# Patient Record
Sex: Female | Born: 1975 | Race: White | Hispanic: No | Smoking: Current every day smoker
Health system: Southern US, Community
[De-identification: ages and names within clinical notes are randomized; demographics above are authoritative.]

## PROBLEM LIST (undated history)

## (undated) DIAGNOSIS — F329 Major depressive disorder, single episode, unspecified: Secondary | ICD-10-CM

## (undated) DIAGNOSIS — F32A Depression, unspecified: Secondary | ICD-10-CM

## (undated) DIAGNOSIS — T7840XA Allergy, unspecified, initial encounter: Secondary | ICD-10-CM

## (undated) DIAGNOSIS — F419 Anxiety disorder, unspecified: Secondary | ICD-10-CM

## (undated) HISTORY — DX: Allergy, unspecified, initial encounter: T78.40XA

## (undated) HISTORY — DX: Anxiety disorder, unspecified: F41.9

## (undated) HISTORY — DX: Major depressive disorder, single episode, unspecified: F32.9

## (undated) HISTORY — DX: Depression, unspecified: F32.A

---

## 2013-02-28 ENCOUNTER — Ambulatory Visit (INDEPENDENT_AMBULATORY_CARE_PROVIDER_SITE_OTHER): Payer: 59 | Admitting: Emergency Medicine

## 2013-02-28 VITALS — BP 124/70 | HR 83 | Temp 97.8°F | Resp 18 | Ht 67.0 in | Wt 223.0 lb

## 2013-02-28 DIAGNOSIS — F329 Major depressive disorder, single episode, unspecified: Secondary | ICD-10-CM

## 2013-02-28 DIAGNOSIS — F3289 Other specified depressive episodes: Secondary | ICD-10-CM

## 2013-02-28 DIAGNOSIS — F32A Depression, unspecified: Secondary | ICD-10-CM

## 2013-02-28 MED ORDER — DESVENLAFAXINE SUCCINATE ER 50 MG PO TB24: 50.0000 mg | ORAL_TABLET | Freq: Every day | ORAL | Status: AC

## 2013-02-28 NOTE — Patient Instructions (Signed)

## 2013-02-28 NOTE — Progress Notes (Signed)
[facility] [address], [place] [address] [phone] [phone]  Date:  02/28/2013   Name:  Jullie Arps   DOB:  Sep 05, 1975   MRN:  [id number]  PCP:  No primary provider on file.    Chief Complaint: rx refills   History of Present Illness:  Stpehanie Montroy is a 37 y.o. very pleasant female patient who presents with the following:  [place] and wants to establish care needs a refill on her antidepressant which she has taken for years.  Says it has controlled her symptoms well.  No adverse effects.  No improvement with over the counter medications or other home remedies. Denies other complaint or health concern today.   There are no active problems to display for this patient.   Past Medical History  Diagnosis Date  . Allergy   . Anxiety   . Depression     History reviewed. No pertinent past surgical history.  History  Substance Use Topics  . Smoking status: Current Every Day Smoker  . Smokeless tobacco: Not on file  . Alcohol Use: Yes    Family History  Problem Relation Age of Onset  . Hyperlipidemia Father   . Cancer Maternal Grandmother   . Mental illness Maternal Grandfather   . Heart disease Paternal Grandfather   . Hyperlipidemia Paternal Grandfather     No Known Allergies  Medication list has been reviewed and updated.  No current outpatient prescriptions on file prior to visit.   No current facility-administered medications on file prior to visit.    Review of Systems:  As per HPI, otherwise negative.    Physical Examination: Filed Vitals:   02/28/13 1505  BP: 124/70  Pulse: 83  Temp: 97.8 F (36.6 C)  Resp: 18   Filed Vitals:   02/28/13 1505  Height: 5\' 7"  (1.702 m)  Weight: 223 lb (101.152 kg)   Body mass index is 34.92 kg/(m^2). Ideal Body Weight: Weight in (lb) to have BMI = 25: 159.3  GEN: WDWN, NAD, Non-toxic, A & O x 3 HEENT: Atraumatic, Normocephalic. Neck supple. No masses, No LAD. Ears  and Nose: No external deformity. CV: RRR, No M/G/R. No JVD. No thrill. No extra heart sounds. PULM: CTA B, no wheezes, crackles, rhonchi. No retractions. No resp. distress. No accessory muscle use. ABD: S, NT, ND, +BS. No rebound. No HSM. EXTR: No c/c/e NEURO Normal gait.  PSYCH: Normally interactive. Conversant. Not depressed or anxious appearing.  Calm demeanor.    Assessment and Plan: Depression Continue medication  Follow up in 104.   Signed,  Phillips Odor, MD

## 2015-03-18 ENCOUNTER — Emergency Department (HOSPITAL_COMMUNITY)
Admission: EM | Admit: 2015-03-18 | Discharge: 2015-03-18 | Disposition: A | Discharge status: Home / Self Care | Payer: BLUE CROSS/BLUE SHIELD | Attending: Emergency Medicine | Admitting: Emergency Medicine

## 2015-03-18 ENCOUNTER — Encounter (HOSPITAL_COMMUNITY): Payer: Self-pay

## 2015-03-18 ENCOUNTER — Emergency Department (HOSPITAL_COMMUNITY): Payer: BLUE CROSS/BLUE SHIELD

## 2015-03-18 DIAGNOSIS — Y9389 Activity, other specified: Secondary | ICD-10-CM | POA: Insufficient documentation

## 2015-03-18 DIAGNOSIS — F329 Major depressive disorder, single episode, unspecified: Secondary | ICD-10-CM | POA: Insufficient documentation

## 2015-03-18 DIAGNOSIS — W540XXA Bitten by dog, initial encounter: Secondary | ICD-10-CM | POA: Insufficient documentation

## 2015-03-18 DIAGNOSIS — F17200 Nicotine dependence, unspecified, uncomplicated: Secondary | ICD-10-CM | POA: Diagnosis not present

## 2015-03-18 DIAGNOSIS — F419 Anxiety disorder, unspecified: Secondary | ICD-10-CM | POA: Diagnosis not present

## 2015-03-18 DIAGNOSIS — S61451A Open bite of right hand, initial encounter: Secondary | ICD-10-CM | POA: Diagnosis not present

## 2015-03-18 DIAGNOSIS — Y998 Other external cause status: Secondary | ICD-10-CM | POA: Insufficient documentation

## 2015-03-18 DIAGNOSIS — Z79899 Other long term (current) drug therapy: Secondary | ICD-10-CM | POA: Insufficient documentation

## 2015-03-18 DIAGNOSIS — Y9289 Other specified places as the place of occurrence of the external cause: Secondary | ICD-10-CM | POA: Diagnosis not present

## 2015-03-18 MED ORDER — LIDOCAINE-EPINEPHRINE 2 %-1:100000 IJ SOLN
INTRAMUSCULAR | Status: AC
  Filled 2015-03-18: qty 1

## 2015-03-18 MED ORDER — LIDOCAINE-EPINEPHRINE (PF) 2 %-1:200000 IJ SOLN
10.0000 mL | Freq: Once | INTRAMUSCULAR | Status: AC
  Administered 2015-03-18: 10 mL via INTRADERMAL

## 2015-03-18 MED ORDER — AMOXICILLIN-POT CLAVULANATE 875-125 MG PO TABS: 1.0000 | ORAL_TABLET | Freq: Two times a day (BID) | ORAL | Status: AC

## 2015-03-18 MED ORDER — TRAMADOL HCL 50 MG PO TABS: 50.0000 mg | ORAL_TABLET | Freq: Four times a day (QID) | ORAL | Status: AC | PRN

## 2015-03-18 MED ORDER — AMOXICILLIN-POT CLAVULANATE 875-125 MG PO TABS
1.0000 | ORAL_TABLET | Freq: Once | ORAL | Status: AC
  Administered 2015-03-18: 1 via ORAL
  Filled 2015-03-18: qty 1

## 2015-03-18 NOTE — ED Provider Notes (Signed)
CSN: [id number]     Arrival date & time 03/18/15  2215 History  By signing my name below, I, Doreatha Martin, attest that this documentation has been prepared under the direction and in the presence of Thi Sisemore, PA-C.  Electronically Signed: Doreatha Martin, ED Scribe. 03/18/2015. 11:08 PM.    Chief Complaint  Patient presents with  . Animal Bite   The history is provided by the patient. No language interpreter was used.    HPI Comments: Anna Hinton is a 39 y.o. female who presents to the Emergency Department complaining of multiple puncture wounds with controlled bleeding to the dorsum of the right hand after a dog bite that occurred at 4 hours ago at 1900. The dogs shots are UTD as of today. She notes that she is fostering the rescue dog and just picked him up today. She reports that the bite was not malicious. Pt states she washed the wounds, applied hydrogen peroxide and antibiotic ointment PTA. She is complaining of moderate pain and swelling around the wounds. Tdap less than 6 years ago. No other injuries. She denies fever or numbness.   Past Medical History  Diagnosis Date  . Allergy   . Anxiety   . Depression    History reviewed. No pertinent past surgical history. Family History  Problem Relation Age of Onset  . Hyperlipidemia Father   . Cancer Maternal Grandmother   . Mental illness Maternal Grandfather   . Heart disease Paternal Grandfather   . Hyperlipidemia Paternal Grandfather    Social History  Substance Use Topics  . Smoking status: Current Every Day Smoker  . Smokeless tobacco: None  . Alcohol Use: Yes   OB History    No data available     Review of Systems  Constitutional: Negative for fever.  Skin: Positive for wound.  Neurological: Negative for numbness.   Allergies  Review of patient's allergies indicates no known allergies.  Home Medications   Prior to Admission medications   Medication Sig Start Date End Date Taking? Authorizing Provider   ALPRAZolam Prudy Feeler) 0.5 MG tablet Take 0.5 mg by mouth at bedtime as needed for sleep. Just takes 1 tablet to sleep prn    Historical Provider, MD  cetirizine (ZYRTEC) 10 MG tablet Take 10 mg by mouth daily.    Historical Provider, MD  desvenlafaxine (PRISTIQ) 50 MG 24 hr tablet Take 1 tablet (50 mg total) by mouth daily. 02/28/13   Carmelina Dane, MD  Pseudoephedrine HCl (SUDAFED 24 HOUR PO) Take by mouth.    Historical Provider, MD   BP 134/91 mmHg  Pulse 101  Temp(Src) 98.2 F (36.8 C) (Oral)  Resp 20  SpO2 100% Physical Exam  Constitutional: She is oriented to person, place, and time. She appears well-developed and well-nourished.  HENT:  Head: Normocephalic and atraumatic.  Eyes: Conjunctivae and EOM are normal. Pupils are equal, round, and reactive to light.  Neck: Normal range of motion. Neck supple.  Cardiovascular: Normal rate.   Pulmonary/Chest: Effort normal. No respiratory distress.  Abdominal: She exhibits no distension.  Musculoskeletal: Normal range of motion.  Several puncture marks to the right dorsal hand. Mild swelling noted over the second and third metacarpals around the puncture wounds. Small puncture wound to the thumb. Appears superficial. Full range of motion of all fingers. Strength is intact with flexion and extension. No erythema surrounding the puncture marks.  Neurological: She is alert and oriented to person, place, and time.  Skin: Skin is warm and dry.  Psychiatric: She has a normal mood and affect. Her behavior is normal.  Nursing note and vitals reviewed.  ED Course  Procedures (including critical care time) DIAGNOSTIC STUDIES: Oxygen Saturation is 100% on RA, normal by my interpretation.    COORDINATION OF CARE: 11:06 PM Discussed treatment plan with pt at bedside and pt agreed to plan.   Imaging Review No results found. I have personally reviewed and evaluated these images as part of my medical decision-making.  MDM   Final diagnoses:   Dog bite of hand without complication, right, initial encounter   Patient with several puncture wounds to the hand. X-rays negative. Numbed puncture wounds with lidocaine, thoroughly irrigated with normal saline using 20-gauge IV catheter. Scrubbed with iodine surgical scrub. Bacitracin and dressing applied. Home with Augmentin prophylactically. Follow-up with hand or return precautions discussed. Pt's tetanus is up to date. States dogs rabies up to date, will verify tomorrow morning.   Filed Vitals:   03/18/15 2223  BP: 134/91  Pulse: 101  Temp: 98.2 F (36.8 C)  TempSrc: Oral  Resp: 20  SpO2: 100%    I personally performed the services described in this documentation, which was scribed in my presence. The recorded information has been reviewed and is accurate.   Jaynie Crumbleatyana Navil Kole, PA-C 03/19/15 [id number]  Tomasita CrumbleAdeleke Oni, MD 03/19/15 [phone]

## 2015-03-18 NOTE — Discharge Instructions (Signed)
Ibuprofen or tylenol for pain. Tramadol for severe pain only. augmentin for infection until all gone. If worsening return to ED or follow up with Dr. Merlyn LotKuzma.    Animal Bite Animal bites can range from mild to serious. An animal bite can result in a scratch on the skin, a deep open cut, a puncture of the skin, a crush injury, or tearing away of the skin or a body part. A small bite from a house pet will usually not cause serious problems. However, some animal bites can become infected or injure a bone or other tissue.  Bites from certain animals can be more dangerous because of the risk of spreading rabies, which is a serious viral infection. This risk is higher with bites from stray animals or wild animals, such as raccoons, foxes, skunks, and bats. Dogs are responsible for most animal bites. Children are bitten more often than adults. SYMPTOMS  Common symptoms of an animal bite include:   Pain.   Bleeding.   Swelling.   Bruising.  DIAGNOSIS  This condition may be diagnosed based on a physical exam and medical history. Your health care provider will examine the wound and ask for details about the animal and how the bite happened. You may also have tests, such as:   Blood tests to check for infection or to determine if surgery is needed.  X-rays to check for damage to bones or joints.  Culture test. This uses a sample of fluid from the wound to check for infection. TREATMENT  Treatment varies depending on the location and type of animal bite and your medical history. Treatment may include:   Wound care. This often includes cleaning the wound, flushing the wound with saline solution, and applying a bandage (dressing). Sometimes, the wound is left open to heal because of the high risk of infection. However, in some cases, the wound may be closed with stitches (sutures), staples, skin glue, or adhesive strips.   Antibiotic medicine.   Tetanus shot.   Rabies treatment if the animal  could have rabies.  In some cases, bites that have become infected may require IV antibiotics and surgical treatment in the hospital.  HOME CARE INSTRUCTIONS Wound Care  Follow instructions from your health care provider about how to take care of your wound. Make sure you:  Wash your hands with soap and water before you change your dressing. If soap and water are not available, use hand sanitizer.  Change your dressing as told by your health care provider.  Leave sutures, skin glue, or adhesive strips in place. These skin closures may need to be in place for 2 weeks or longer. If adhesive strip edges start to loosen and curl up, you may trim the loose edges. Do not remove adhesive strips completely unless your health care provider tells you to do that.  Check your wound every day for signs of infection. Watch for:   Increasing redness, swelling, or pain.   Fluid, blood, or pus.  General Instructions  Take or apply over-the-counter and prescription medicines only as told by your health care provider.   If you were prescribed an antibiotic, take or apply it as told by your health care provider. Do not stop using the antibiotic even if your condition improves.   Keep the injured area raised (elevated) above the level of your heart while you are sitting or lying down, if this is possible.   If directed, apply ice to the injured area.   Put ice  in a plastic bag.   Place a towel between your skin and the bag.   Leave the ice on for 20 minutes, 2-3 times per day.   Keep all follow-up visits as told by your health care provider. This is important.  SEEK MEDICAL CARE IF:  You have increasing redness, swelling, or pain at the site of your wound.   You have a general feeling of sickness (malaise).   You feel nauseous or you vomit.   You have pain that does not get better.  SEEK IMMEDIATE MEDICAL CARE IF:  You have a red streak extending away from your wound.    You have fluid, blood, or pus coming from your wound.   You have a fever or chills.   You have trouble moving your injured area.   You have numbness or tingling extending beyond the wound.   This information is not intended to replace advice given to you by your health care provider. Make sure you discuss any questions you have with your health care provider.   Document Released: 01/03/2011 Document Revised: 01/06/2015 Document Reviewed: 09/02/2014 Elsevier Interactive Patient Education [employer]

## 2015-03-18 NOTE — ED Notes (Signed)
Pt got a rescue dog today and it bit her on the hand this evening, the dog has had it's rabies vaccine

## 2015-09-16 DIAGNOSIS — M9901 Segmental and somatic dysfunction of cervical region: Secondary | ICD-10-CM | POA: Diagnosis not present

## 2015-09-16 DIAGNOSIS — M9903 Segmental and somatic dysfunction of lumbar region: Secondary | ICD-10-CM | POA: Diagnosis not present

## 2015-09-16 DIAGNOSIS — G44209 Tension-type headache, unspecified, not intractable: Secondary | ICD-10-CM | POA: Diagnosis not present

## 2015-09-16 DIAGNOSIS — R201 Hypoesthesia of skin: Secondary | ICD-10-CM | POA: Diagnosis not present

## 2015-09-20 DIAGNOSIS — R201 Hypoesthesia of skin: Secondary | ICD-10-CM | POA: Diagnosis not present

## 2015-09-20 DIAGNOSIS — M9903 Segmental and somatic dysfunction of lumbar region: Secondary | ICD-10-CM | POA: Diagnosis not present

## 2015-09-20 DIAGNOSIS — M9901 Segmental and somatic dysfunction of cervical region: Secondary | ICD-10-CM | POA: Diagnosis not present

## 2015-09-20 DIAGNOSIS — G44209 Tension-type headache, unspecified, not intractable: Secondary | ICD-10-CM | POA: Diagnosis not present

## 2015-09-24 DIAGNOSIS — R201 Hypoesthesia of skin: Secondary | ICD-10-CM | POA: Diagnosis not present

## 2015-09-24 DIAGNOSIS — M9903 Segmental and somatic dysfunction of lumbar region: Secondary | ICD-10-CM | POA: Diagnosis not present

## 2015-09-24 DIAGNOSIS — M9901 Segmental and somatic dysfunction of cervical region: Secondary | ICD-10-CM | POA: Diagnosis not present

## 2015-09-24 DIAGNOSIS — G44209 Tension-type headache, unspecified, not intractable: Secondary | ICD-10-CM | POA: Diagnosis not present

## 2015-09-29 DIAGNOSIS — M9901 Segmental and somatic dysfunction of cervical region: Secondary | ICD-10-CM | POA: Diagnosis not present

## 2015-09-29 DIAGNOSIS — G44209 Tension-type headache, unspecified, not intractable: Secondary | ICD-10-CM | POA: Diagnosis not present

## 2015-09-29 DIAGNOSIS — M9903 Segmental and somatic dysfunction of lumbar region: Secondary | ICD-10-CM | POA: Diagnosis not present

## 2015-09-29 DIAGNOSIS — R201 Hypoesthesia of skin: Secondary | ICD-10-CM | POA: Diagnosis not present

## 2015-10-01 DIAGNOSIS — R201 Hypoesthesia of skin: Secondary | ICD-10-CM | POA: Diagnosis not present

## 2015-10-01 DIAGNOSIS — G44209 Tension-type headache, unspecified, not intractable: Secondary | ICD-10-CM | POA: Diagnosis not present

## 2015-10-01 DIAGNOSIS — M9901 Segmental and somatic dysfunction of cervical region: Secondary | ICD-10-CM | POA: Diagnosis not present

## 2015-10-01 DIAGNOSIS — M9903 Segmental and somatic dysfunction of lumbar region: Secondary | ICD-10-CM | POA: Diagnosis not present

## 2015-10-08 DIAGNOSIS — M9901 Segmental and somatic dysfunction of cervical region: Secondary | ICD-10-CM | POA: Diagnosis not present

## 2015-10-08 DIAGNOSIS — R201 Hypoesthesia of skin: Secondary | ICD-10-CM | POA: Diagnosis not present

## 2015-10-08 DIAGNOSIS — M9903 Segmental and somatic dysfunction of lumbar region: Secondary | ICD-10-CM | POA: Diagnosis not present

## 2015-10-08 DIAGNOSIS — G44209 Tension-type headache, unspecified, not intractable: Secondary | ICD-10-CM | POA: Diagnosis not present

## 2015-10-12 DIAGNOSIS — M9903 Segmental and somatic dysfunction of lumbar region: Secondary | ICD-10-CM | POA: Diagnosis not present

## 2015-10-12 DIAGNOSIS — R201 Hypoesthesia of skin: Secondary | ICD-10-CM | POA: Diagnosis not present

## 2015-10-12 DIAGNOSIS — G44209 Tension-type headache, unspecified, not intractable: Secondary | ICD-10-CM | POA: Diagnosis not present

## 2015-10-12 DIAGNOSIS — M9901 Segmental and somatic dysfunction of cervical region: Secondary | ICD-10-CM | POA: Diagnosis not present

## 2015-10-15 DIAGNOSIS — M9903 Segmental and somatic dysfunction of lumbar region: Secondary | ICD-10-CM | POA: Diagnosis not present

## 2015-10-15 DIAGNOSIS — M9901 Segmental and somatic dysfunction of cervical region: Secondary | ICD-10-CM | POA: Diagnosis not present

## 2015-10-15 DIAGNOSIS — R201 Hypoesthesia of skin: Secondary | ICD-10-CM | POA: Diagnosis not present

## 2015-10-15 DIAGNOSIS — G44209 Tension-type headache, unspecified, not intractable: Secondary | ICD-10-CM | POA: Diagnosis not present

## 2015-10-19 DIAGNOSIS — M9903 Segmental and somatic dysfunction of lumbar region: Secondary | ICD-10-CM | POA: Diagnosis not present

## 2015-10-19 DIAGNOSIS — G44209 Tension-type headache, unspecified, not intractable: Secondary | ICD-10-CM | POA: Diagnosis not present

## 2015-10-19 DIAGNOSIS — M9901 Segmental and somatic dysfunction of cervical region: Secondary | ICD-10-CM | POA: Diagnosis not present

## 2015-10-19 DIAGNOSIS — R201 Hypoesthesia of skin: Secondary | ICD-10-CM | POA: Diagnosis not present

## 2015-10-22 DIAGNOSIS — R201 Hypoesthesia of skin: Secondary | ICD-10-CM | POA: Diagnosis not present

## 2015-10-22 DIAGNOSIS — M9903 Segmental and somatic dysfunction of lumbar region: Secondary | ICD-10-CM | POA: Diagnosis not present

## 2015-10-22 DIAGNOSIS — M9901 Segmental and somatic dysfunction of cervical region: Secondary | ICD-10-CM | POA: Diagnosis not present

## 2015-10-22 DIAGNOSIS — G44209 Tension-type headache, unspecified, not intractable: Secondary | ICD-10-CM | POA: Diagnosis not present

## 2015-11-01 DIAGNOSIS — M9901 Segmental and somatic dysfunction of cervical region: Secondary | ICD-10-CM | POA: Diagnosis not present

## 2015-11-01 DIAGNOSIS — R201 Hypoesthesia of skin: Secondary | ICD-10-CM | POA: Diagnosis not present

## 2015-11-01 DIAGNOSIS — M9903 Segmental and somatic dysfunction of lumbar region: Secondary | ICD-10-CM | POA: Diagnosis not present

## 2015-11-01 DIAGNOSIS — G44209 Tension-type headache, unspecified, not intractable: Secondary | ICD-10-CM | POA: Diagnosis not present

## 2015-12-03 DIAGNOSIS — M9901 Segmental and somatic dysfunction of cervical region: Secondary | ICD-10-CM | POA: Diagnosis not present

## 2015-12-03 DIAGNOSIS — G44209 Tension-type headache, unspecified, not intractable: Secondary | ICD-10-CM | POA: Diagnosis not present

## 2015-12-03 DIAGNOSIS — M9902 Segmental and somatic dysfunction of thoracic region: Secondary | ICD-10-CM | POA: Diagnosis not present

## 2015-12-03 DIAGNOSIS — M9903 Segmental and somatic dysfunction of lumbar region: Secondary | ICD-10-CM | POA: Diagnosis not present

## 2016-04-04 DIAGNOSIS — M9902 Segmental and somatic dysfunction of thoracic region: Secondary | ICD-10-CM | POA: Diagnosis not present

## 2016-04-04 DIAGNOSIS — M9903 Segmental and somatic dysfunction of lumbar region: Secondary | ICD-10-CM | POA: Diagnosis not present

## 2016-04-04 DIAGNOSIS — G44209 Tension-type headache, unspecified, not intractable: Secondary | ICD-10-CM | POA: Diagnosis not present

## 2016-04-04 DIAGNOSIS — M9901 Segmental and somatic dysfunction of cervical region: Secondary | ICD-10-CM | POA: Diagnosis not present

## 2016-05-02 DIAGNOSIS — J209 Acute bronchitis, unspecified: Secondary | ICD-10-CM | POA: Diagnosis not present

## 2016-06-07 DIAGNOSIS — G44209 Tension-type headache, unspecified, not intractable: Secondary | ICD-10-CM | POA: Diagnosis not present

## 2016-06-07 DIAGNOSIS — M9903 Segmental and somatic dysfunction of lumbar region: Secondary | ICD-10-CM | POA: Diagnosis not present

## 2016-06-07 DIAGNOSIS — M9902 Segmental and somatic dysfunction of thoracic region: Secondary | ICD-10-CM | POA: Diagnosis not present

## 2016-06-07 DIAGNOSIS — M9901 Segmental and somatic dysfunction of cervical region: Secondary | ICD-10-CM | POA: Diagnosis not present

## 2016-08-04 DIAGNOSIS — M9902 Segmental and somatic dysfunction of thoracic region: Secondary | ICD-10-CM | POA: Diagnosis not present

## 2016-08-04 DIAGNOSIS — M9903 Segmental and somatic dysfunction of lumbar region: Secondary | ICD-10-CM | POA: Diagnosis not present

## 2016-08-04 DIAGNOSIS — M9901 Segmental and somatic dysfunction of cervical region: Secondary | ICD-10-CM | POA: Diagnosis not present

## 2016-08-04 DIAGNOSIS — G44209 Tension-type headache, unspecified, not intractable: Secondary | ICD-10-CM | POA: Diagnosis not present

## 2016-08-23 DIAGNOSIS — M9903 Segmental and somatic dysfunction of lumbar region: Secondary | ICD-10-CM | POA: Diagnosis not present

## 2016-08-23 DIAGNOSIS — G44209 Tension-type headache, unspecified, not intractable: Secondary | ICD-10-CM | POA: Diagnosis not present

## 2016-08-23 DIAGNOSIS — M9902 Segmental and somatic dysfunction of thoracic region: Secondary | ICD-10-CM | POA: Diagnosis not present

## 2016-08-23 DIAGNOSIS — M9901 Segmental and somatic dysfunction of cervical region: Secondary | ICD-10-CM | POA: Diagnosis not present

## 2016-10-09 ENCOUNTER — Encounter: Payer: Self-pay | Admitting: Emergency Medicine

## 2016-10-09 ENCOUNTER — Ambulatory Visit (INDEPENDENT_AMBULATORY_CARE_PROVIDER_SITE_OTHER): Payer: BLUE CROSS/BLUE SHIELD | Admitting: Emergency Medicine

## 2016-10-09 VITALS — BP 127/86 | HR 93 | Temp 98.1°F | Resp 16 | Ht 67.5 in | Wt 231.6 lb

## 2016-10-09 DIAGNOSIS — S91121A Laceration with foreign body of right great toe without damage to nail, initial encounter: Secondary | ICD-10-CM

## 2016-10-09 MED ORDER — CEPHALEXIN 500 MG PO CAPS: 500.0000 mg | ORAL_CAPSULE | Freq: Three times a day (TID) | ORAL | 0 refills | Status: AC

## 2016-10-09 NOTE — Progress Notes (Signed)
Anna Hinton 41 y.o.   Chief Complaint  Patient presents with  . Toe Pain    right big toe, onset: 10/09/16 morning, flip flop caught on step and tore skin    HISTORY OF PRESENT ILLNESS: This is a 41 y.o. female complaining of laceration to right big toe sustained this am; has dirt in the wound she couldn't get out.  HPI   Prior to Admission medications   Medication Sig Start Date End Date Taking? Authorizing Provider  cetirizine (ZYRTEC) 10 MG tablet Take 10 mg by mouth daily.   Yes [provider]  Pseudoephedrine HCl (SUDAFED 24 HOUR PO) Take by mouth.   Yes [provider]  ALPRAZolam Prudy Feeler) 0.5 MG tablet Take 0.5 mg by mouth at bedtime as needed for sleep. Just takes 1 tablet to sleep prn    [provider]  amoxicillin-clavulanate (AUGMENTIN) 875-125 MG tablet Take 1 tablet by mouth 2 (two) times daily. Patient not taking: Reported on 10/09/2016 03/18/15   Jaynie Crumble, PA-C  desvenlafaxine (PRISTIQ) 50 MG 24 hr tablet Take 1 tablet (50 mg total) by mouth daily. Patient not taking: Reported on 10/09/2016 02/28/13   Carmelina Dane, MD  traMADol (ULTRAM) 50 MG tablet Take 1 tablet (50 mg total) by mouth every 6 (six) hours as needed. Patient not taking: Reported on 10/09/2016 03/18/15   Jaynie Crumble, PA-C    No Known Allergies  There are no active problems to display for this patient.   Past Medical History:  Diagnosis Date  . Allergy   . Anxiety   . Depression     No past surgical history on file.  Social History   Social History  . Marital status: Significant Other    Spouse name: N/A  . Number of children: N/A  . Years of education: N/A   Occupational History  . Not on file.   Social History Main Topics  . Smoking status: Current Every Day Smoker  . Smokeless tobacco: Never Used  . Alcohol use Yes  . Drug use: Unknown  . Sexual activity: Not on file   Other Topics Concern  . Not on file   Social  History Narrative  . No narrative on file    Family History  Problem Relation Age of Onset  . Hyperlipidemia Father   . Cancer Maternal Grandmother   . Mental illness Maternal Grandfather   . Heart disease Paternal Grandfather   . Hyperlipidemia Paternal Grandfather      Review of Systems  Constitutional: Negative for chills and fever.  Respiratory: Negative for shortness of breath.   Gastrointestinal: Negative for nausea and vomiting.  Neurological: Negative for dizziness and headaches.  All other systems reviewed and are negative.  Vitals:   10/09/16 1219  BP: 127/86  Pulse: 93  Resp: 16  Temp: 98.1 F (36.7 C)     Physical Exam  Constitutional: She is oriented to person, place, and time. She appears well-developed.  Obese.  HENT:  Head: Normocephalic and atraumatic.  Eyes: EOM are normal. Pupils are equal, round, and reactive to light.  Neck: Normal range of motion. Neck supple.  Cardiovascular: Normal rate and regular rhythm.   Pulmonary/Chest: Effort normal.  Musculoskeletal: Normal range of motion.  Right big toe: flap-like laceration to plantar surface 3-4 cm long, semi-circular. NVI; +debri particles identified.  Neurological: She is alert and oriented to person, place, and time.  Skin: Skin is warm and dry. Capillary refill takes less than 2 seconds.  Psychiatric: She has a normal mood and affect. Her behavior is normal.  Vitals reviewed.   Procedure Note: Digital block with 1% plain Lidocaine; multiple small FB's identified (gravel particles); scraped off with #15 blade and copiously irrigated with NSS; Bactroban applied followed by Xeroform dressing and gauze.  ASSESSMENT & PLAN: [name] was seen today for toe pain.  Diagnoses and all orders for this visit:  Laceration of right great toe with foreign body without damage to nail, initial encounter  Other orders -     cephALEXin (KEFLEX) 500 MG capsule; Take 1 capsule (500 mg total) by mouth 3  (three) times daily.    Patient Instructions       IF you received an x-ray today, you will receive an invoice from [facility]. Please contact [facility] at [phone] with questions or concerns regarding your invoice.   IF you received labwork today, you will receive an invoice from [place]. Please contact [facility] at [phone] with questions or concerns regarding your invoice.   Our billing staff will not be able to assist you with questions regarding bills from these companies.  You will be contacted with the lab results as soon as they are available. The fastest way to get your results is to activate your My Chart account. Instructions are located on the last page of this paperwork. If you have not heard from [place] regarding the results in 2 weeks, please contact this office.    We recommend that you schedule a mammogram for breast cancer screening. Typically, you do not need a referral to do this. Please contact a local imaging center to schedule your mammogram.  [facility] - [phone]  *ask for the Radiology Department [facility]) - [phone] or [phone]  [facility] - [phone] [facility] - [phone] [facility] - [phone]  *ask for the Radiology Department [facility] - [phone]  *ask for the Radiology Department [facility] - [phone]  *ask for the Mammography Department [facility] - [phone] Wound Care, Adult Taking care of your wound properly can help to prevent pain and infection. It can also help your wound to heal more quickly. How is this treated? Wound care  Follow instructions from your health care provider about how to take care of your wound. Make sure you: ? Wash your hands with soap and water before you change the bandage (dressing). If soap and water are not available, use hand  sanitizer. ? Change your dressing as told by your health care provider. ? Leave stitches (sutures), skin glue, or adhesive strips in place. These skin closures may need to stay in place for 2 weeks or longer. If adhesive strip edges start to loosen and curl up, you may trim the loose edges. Do not remove adhesive strips completely unless your health care provider tells you to do that.  Check your wound area every day for signs of infection. Check for: ? More redness, swelling, or pain. ? More fluid or blood. ? Warmth. ? Pus or a bad smell.  Ask your health care provider if you should clean the wound with mild soap and water. Doing this may include: ? Using a clean towel to pat the wound dry after cleaning it. Do not rub or scrub the wound. ? Applying a cream or ointment. Do this only as told by your health care provider. ? Covering the incision with a clean dressing.  Ask your health care provider when you can leave the wound uncovered. Medicines   If you were prescribed an antibiotic medicine, cream, or ointment, take or use the antibiotic as told by your health care provider. Do not stop taking or using the antibiotic even if your condition improves.  Take over-the-counter and prescription medicines only as told by your health care provider. If you were prescribed pain medicine, take it at least 30 minutes before doing any wound care or as told by your health care provider. General instructions  Return to your normal activities as told by your health care provider. Ask your health care provider what activities are safe.  Do not scratch or pick at the wound.  Keep all follow-up visits as told by your health care provider. This is important.  Eat a diet that includes protein, vitamin A, vitamin C, and other nutrient-rich foods. These help the wound heal: ? Protein-rich foods include meat, dairy, beans, nuts, and other sources. ? Vitamin A-rich foods include carrots and dark green,  leafy vegetables. ? Vitamin C-rich foods include citrus, tomatoes, and other fruits and vegetables. ? Nutrient-rich foods have protein, carbohydrates, fat, vitamins, or minerals. Eat a variety of healthy foods including vegetables, fruits, and whole grains. Contact a health care provider if:  You received a tetanus shot and you have swelling, severe pain, redness, or bleeding at the injection site.  Your pain is not controlled with medicine.  You have more redness, swelling, or pain around the wound.  You have more fluid or blood coming from the wound.  Your wound feels warm to the touch.  You have pus or a bad smell coming from the wound.  You have a fever or chills.  You are nauseous or you vomit.  You are dizzy. Get help right away if:  You have a red streak going away from your wound.  The edges of the wound open up and separate.  Your wound is bleeding and the bleeding does not stop with gentle pressure.  You have a rash.  You faint.  You have trouble breathing. This information is not intended to replace advice given to you by your health care provider. Make sure you discuss any questions you have with your health care provider. Document Released: 01/25/2008 Document Revised: 12/15/2015 Document Reviewed: 11/02/2015 Elsevier Interactive Patient Education  2017 Else[employer] Inc[employer]      Edwina BarthMiguel Montez Stryker, MD [facility]

## 2016-10-09 NOTE — Patient Instructions (Addendum)
IF you received an x-ray today, you will receive an invoice from [facility]. Please contact [facility] at [phone] with questions or concerns regarding your invoice.   IF you received labwork today, you will receive an invoice from [facility]. Please contact [facility] at [phone] with questions or concerns regarding your invoice.   Our billing staff will not be able to assist you with questions regarding bills from these companies.  You will be contacted with the lab results as soon as they are available. The fastest way to get your results is to activate your My Chart account. Instructions are located on the last page of this paperwork. If you have not heard from [place] regarding the results in 2 weeks, please contact this office.    We recommend that you schedule a mammogram for breast cancer screening. Typically, you do not need a referral to do this. Please contact a local imaging center to schedule your mammogram.  [facility] - [phone]  *ask for the Radiology Department [facility]) - [phone] or [phone]  [facility] - [phone] [facility] - [phone] [facility] - [phone]  *ask for the Radiology Department [facility] - [phone]  *ask for the Radiology Department [facility] - [phone]  *ask for the Mammography Department [facility] - [phone] Wound Care, Adult Taking care of your wound properly can help to prevent pain and infection. It can also help your wound to heal more quickly. How is this treated? Wound care  Follow instructions from your health care provider about how to take care of your wound. Make sure you: ? Wash your hands with soap and water before you change the bandage (dressing). If soap and water are not available, use hand sanitizer. ? Change your dressing as told by your health care  provider. ? Leave stitches (sutures), skin glue, or adhesive strips in place. These skin closures may need to stay in place for 2 weeks or longer. If adhesive strip edges start to loosen and curl up, you may trim the loose edges. Do not remove adhesive strips completely unless your health care provider tells you to do that.  Check your wound area every day for signs of infection. Check for: ? More redness, swelling, or pain. ? More fluid or blood. ? Warmth. ? Pus or a bad smell.  Ask your health care provider if you should clean the wound with mild soap and water. Doing this may include: ? Using a clean towel to pat the wound dry after cleaning it. Do not rub or scrub the wound. ? Applying a cream or ointment. Do this only as told by your health care provider. ? Covering the incision with a clean dressing.  Ask your health care provider when you can leave the wound uncovered. Medicines   If you were prescribed an antibiotic medicine, cream, or ointment, take or use the antibiotic as told by your health care provider. Do not stop taking or using the antibiotic even if your condition improves.  Take over-the-counter and prescription medicines only as told by your health care provider. If you were prescribed pain medicine, take it at least 30 minutes before doing any wound care or as told by your health care provider. General instructions  Return to your normal activities as told by your health care provider. Ask your health care provider what activities are safe.  Do not  scratch or pick at the wound.  Keep all follow-up visits as told by your health care provider. This is important.  Eat a diet that includes protein, vitamin A, vitamin C, and other nutrient-rich foods. These help the wound heal: ? Protein-rich foods include meat, dairy, beans, nuts, and other sources. ? Vitamin A-rich foods include carrots and dark green, leafy vegetables. ? Vitamin C-rich foods include citrus,  tomatoes, and other fruits and vegetables. ? Nutrient-rich foods have protein, carbohydrates, fat, vitamins, or minerals. Eat a variety of healthy foods including vegetables, fruits, and whole grains. Contact a health care provider if:  You received a tetanus shot and you have swelling, severe pain, redness, or bleeding at the injection site.  Your pain is not controlled with medicine.  You have more redness, swelling, or pain around the wound.  You have more fluid or blood coming from the wound.  Your wound feels warm to the touch.  You have pus or a bad smell coming from the wound.  You have a fever or chills.  You are nauseous or you vomit.  You are dizzy. Get help right away if:  You have a red streak going away from your wound.  The edges of the wound open up and separate.  Your wound is bleeding and the bleeding does not stop with gentle pressure.  You have a rash.  You faint.  You have trouble breathing. This information is not intended to replace advice given to you by your health care provider. Make sure you discuss any questions you have with your health care provider. Document Released: 01/25/2008 Document Revised: 12/15/2015 Document Reviewed: 11/02/2015 [employer] Interactive Patient Education  2017 [employer]

## 2016-12-14 DIAGNOSIS — M9902 Segmental and somatic dysfunction of thoracic region: Secondary | ICD-10-CM | POA: Diagnosis not present

## 2016-12-14 DIAGNOSIS — G44209 Tension-type headache, unspecified, not intractable: Secondary | ICD-10-CM | POA: Diagnosis not present

## 2016-12-14 DIAGNOSIS — M9901 Segmental and somatic dysfunction of cervical region: Secondary | ICD-10-CM | POA: Diagnosis not present

## 2016-12-14 DIAGNOSIS — M9903 Segmental and somatic dysfunction of lumbar region: Secondary | ICD-10-CM | POA: Diagnosis not present

## 2017-02-01 IMAGING — CR DG HAND COMPLETE 3+V*R*
3 series · 3 of 3 positions shown · non-contrast
Comparison: None.

CLINICAL DATA: Dog hit right hand, with swelling. Initial
encounter.

EXAM:
RIGHT HAND - COMPLETE 3+ VIEW

[x hand pa right]
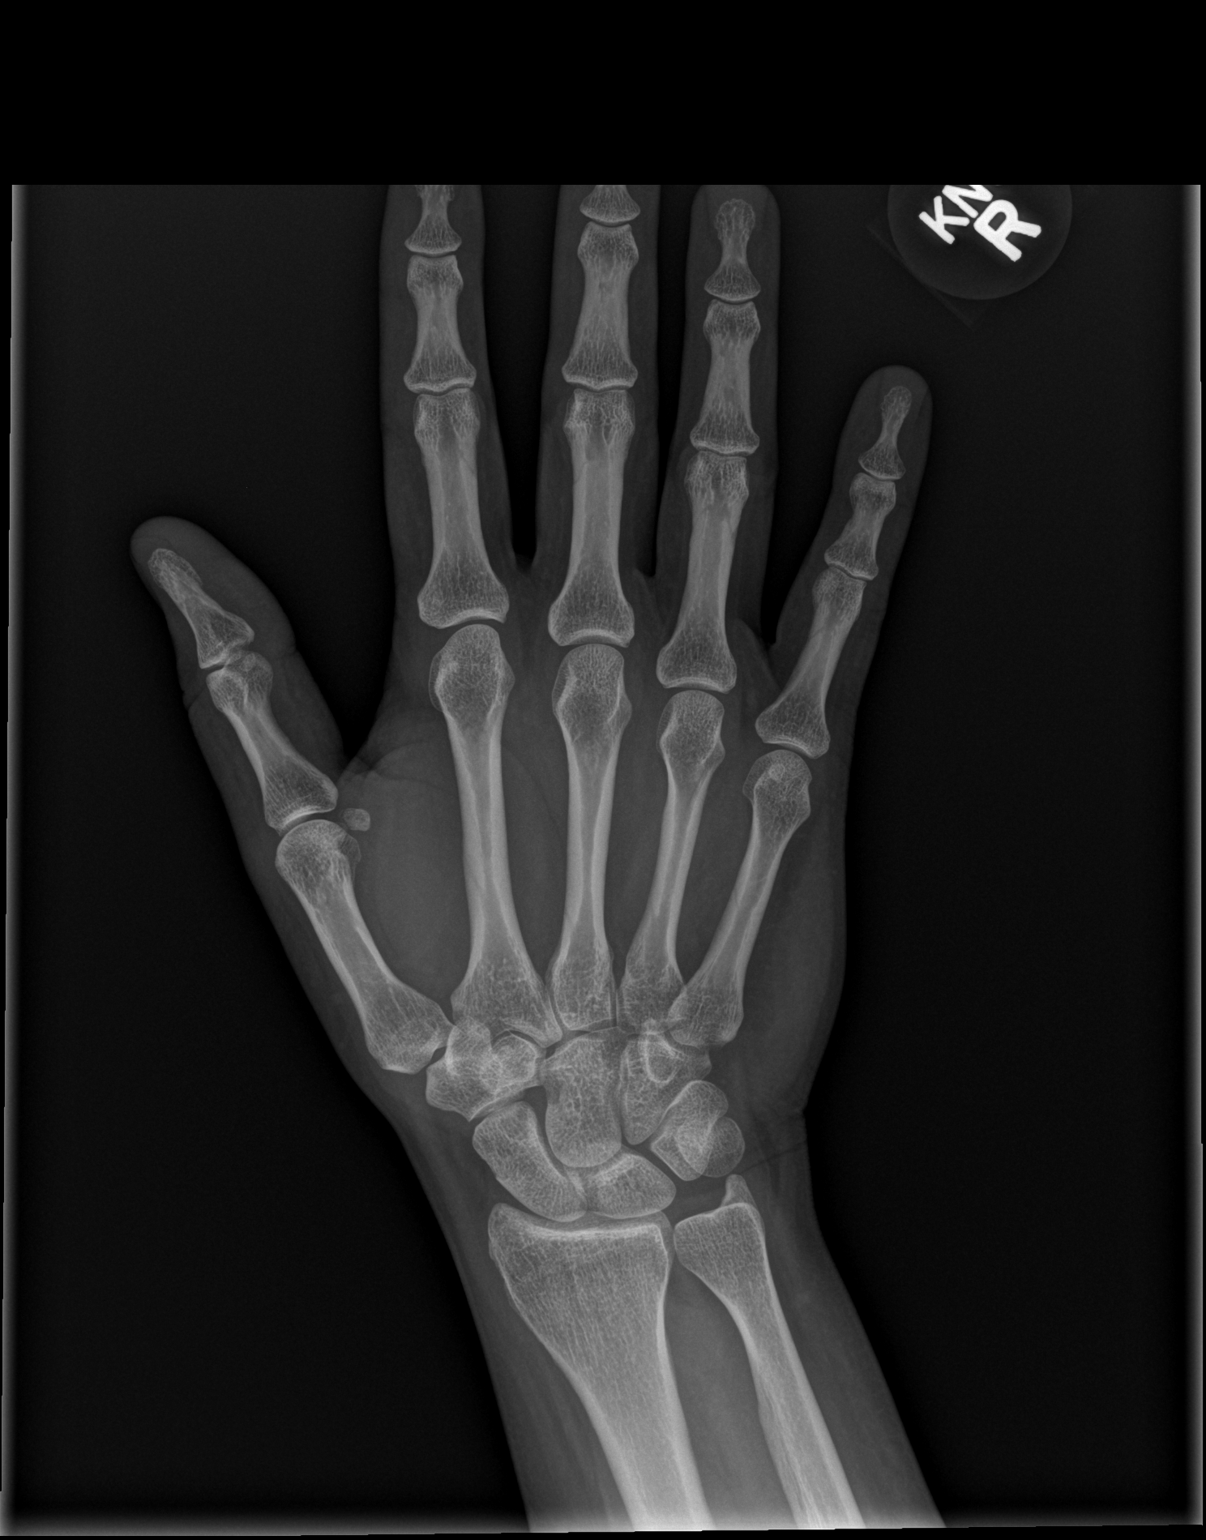

[x hand obl right]
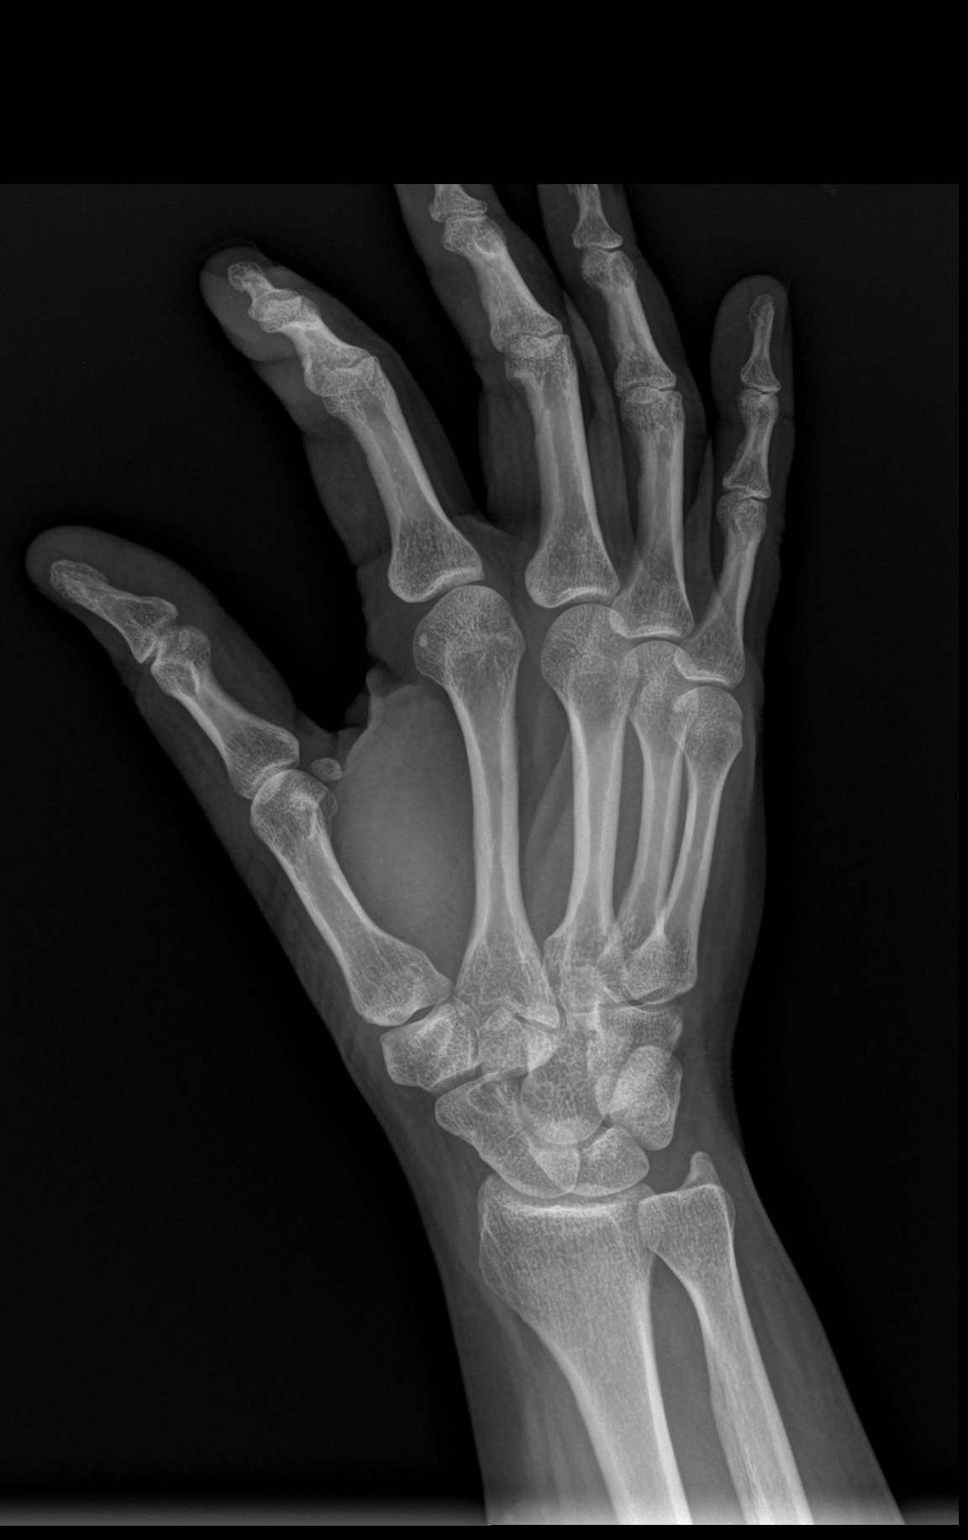

[x hand lat right]
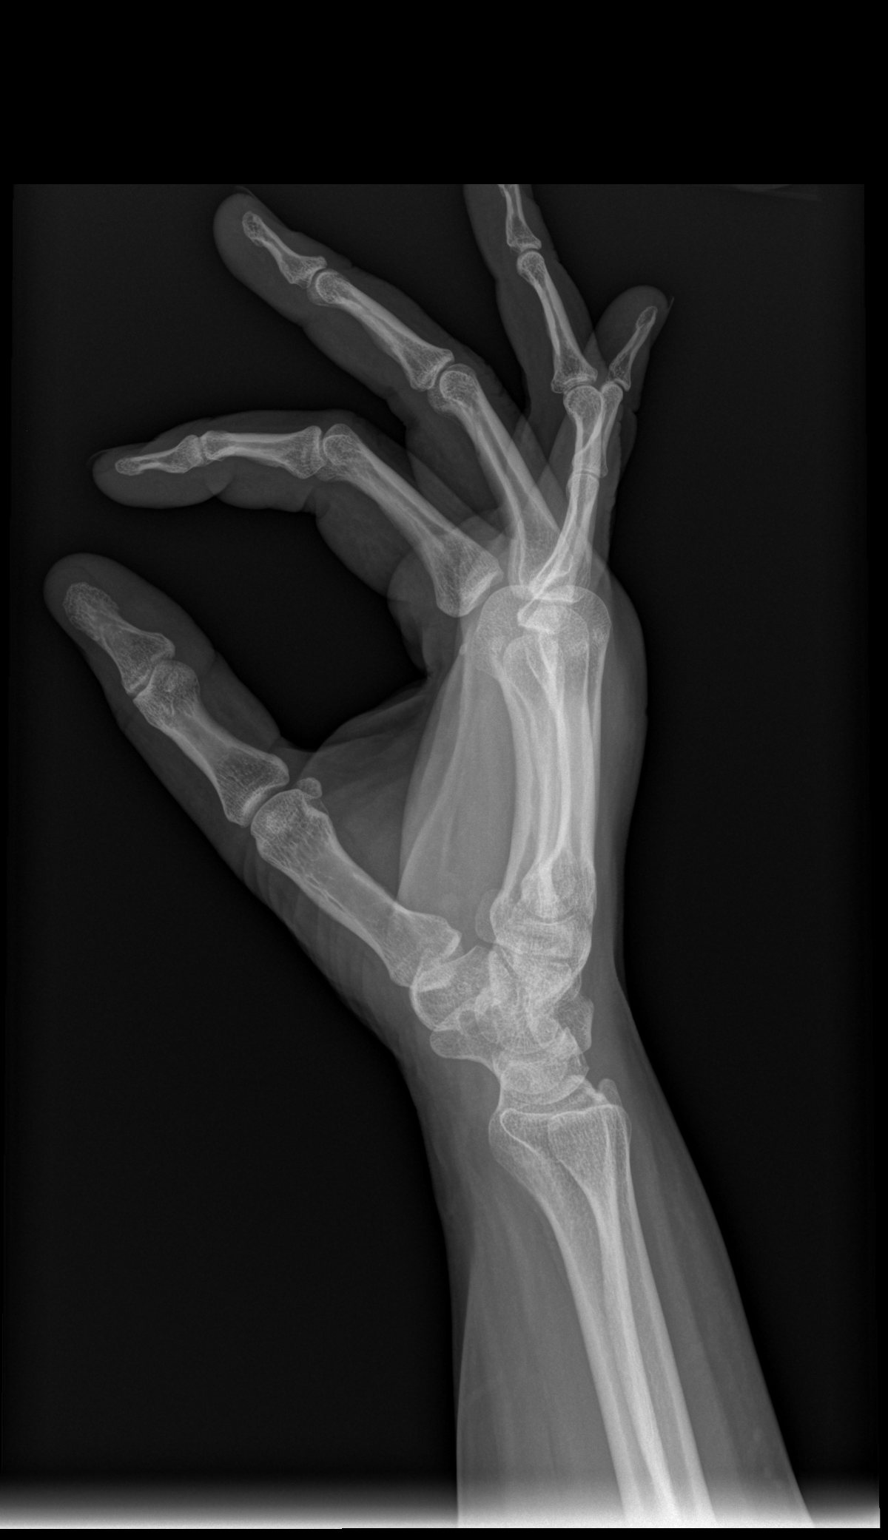

[3 of 3 positions shown; findings below may reference images not displayed]

FINDINGS: There is no evidence of fracture or dislocation. The joint spaces
are preserved. The carpal rows are intact, and demonstrate normal
alignment. Mild dorsal soft tissue swelling and laceration are
noted. No radiopaque foreign bodies are seen.
IMPRESSION: No evidence of fracture or dislocation.

## 2017-04-30 DIAGNOSIS — J019 Acute sinusitis, unspecified: Secondary | ICD-10-CM | POA: Diagnosis not present

## 2017-04-30 DIAGNOSIS — J00 Acute nasopharyngitis [common cold]: Secondary | ICD-10-CM | POA: Diagnosis not present

## 2017-08-17 DIAGNOSIS — M9902 Segmental and somatic dysfunction of thoracic region: Secondary | ICD-10-CM | POA: Diagnosis not present

## 2017-08-17 DIAGNOSIS — G44209 Tension-type headache, unspecified, not intractable: Secondary | ICD-10-CM | POA: Diagnosis not present

## 2017-08-17 DIAGNOSIS — M9903 Segmental and somatic dysfunction of lumbar region: Secondary | ICD-10-CM | POA: Diagnosis not present

## 2017-08-17 DIAGNOSIS — M9901 Segmental and somatic dysfunction of cervical region: Secondary | ICD-10-CM | POA: Diagnosis not present

## 2017-09-05 DIAGNOSIS — M9901 Segmental and somatic dysfunction of cervical region: Secondary | ICD-10-CM | POA: Diagnosis not present

## 2017-09-05 DIAGNOSIS — M9902 Segmental and somatic dysfunction of thoracic region: Secondary | ICD-10-CM | POA: Diagnosis not present

## 2017-09-05 DIAGNOSIS — M9903 Segmental and somatic dysfunction of lumbar region: Secondary | ICD-10-CM | POA: Diagnosis not present

## 2017-09-05 DIAGNOSIS — G44209 Tension-type headache, unspecified, not intractable: Secondary | ICD-10-CM | POA: Diagnosis not present

## 2018-01-10 DIAGNOSIS — M9905 Segmental and somatic dysfunction of pelvic region: Secondary | ICD-10-CM | POA: Diagnosis not present

## 2018-01-10 DIAGNOSIS — M9903 Segmental and somatic dysfunction of lumbar region: Secondary | ICD-10-CM | POA: Diagnosis not present

## 2018-01-10 DIAGNOSIS — M9902 Segmental and somatic dysfunction of thoracic region: Secondary | ICD-10-CM | POA: Diagnosis not present

## 2018-01-10 DIAGNOSIS — M9901 Segmental and somatic dysfunction of cervical region: Secondary | ICD-10-CM | POA: Diagnosis not present

## 2018-02-04 DIAGNOSIS — M9902 Segmental and somatic dysfunction of thoracic region: Secondary | ICD-10-CM | POA: Diagnosis not present

## 2018-02-04 DIAGNOSIS — M9903 Segmental and somatic dysfunction of lumbar region: Secondary | ICD-10-CM | POA: Diagnosis not present

## 2018-02-04 DIAGNOSIS — M9901 Segmental and somatic dysfunction of cervical region: Secondary | ICD-10-CM | POA: Diagnosis not present

## 2018-02-04 DIAGNOSIS — M9905 Segmental and somatic dysfunction of pelvic region: Secondary | ICD-10-CM | POA: Diagnosis not present

## 2018-04-04 DIAGNOSIS — M9905 Segmental and somatic dysfunction of pelvic region: Secondary | ICD-10-CM | POA: Diagnosis not present

## 2018-04-04 DIAGNOSIS — M9902 Segmental and somatic dysfunction of thoracic region: Secondary | ICD-10-CM | POA: Diagnosis not present

## 2018-04-04 DIAGNOSIS — M9901 Segmental and somatic dysfunction of cervical region: Secondary | ICD-10-CM | POA: Diagnosis not present

## 2018-04-04 DIAGNOSIS — M9903 Segmental and somatic dysfunction of lumbar region: Secondary | ICD-10-CM | POA: Diagnosis not present

## 2018-04-12 DIAGNOSIS — M9903 Segmental and somatic dysfunction of lumbar region: Secondary | ICD-10-CM | POA: Diagnosis not present

## 2018-04-12 DIAGNOSIS — M9905 Segmental and somatic dysfunction of pelvic region: Secondary | ICD-10-CM | POA: Diagnosis not present

## 2018-04-12 DIAGNOSIS — M9901 Segmental and somatic dysfunction of cervical region: Secondary | ICD-10-CM | POA: Diagnosis not present

## 2018-04-12 DIAGNOSIS — M9902 Segmental and somatic dysfunction of thoracic region: Secondary | ICD-10-CM | POA: Diagnosis not present

## 2019-04-01 DIAGNOSIS — M9901 Segmental and somatic dysfunction of cervical region: Secondary | ICD-10-CM | POA: Diagnosis not present

## 2019-04-01 DIAGNOSIS — M542 Cervicalgia: Secondary | ICD-10-CM | POA: Diagnosis not present

## 2019-04-01 DIAGNOSIS — M9902 Segmental and somatic dysfunction of thoracic region: Secondary | ICD-10-CM | POA: Diagnosis not present

## 2019-04-01 DIAGNOSIS — M62830 Muscle spasm of back: Secondary | ICD-10-CM | POA: Diagnosis not present

## 2019-04-17 DIAGNOSIS — M542 Cervicalgia: Secondary | ICD-10-CM | POA: Diagnosis not present

## 2019-04-17 DIAGNOSIS — M9902 Segmental and somatic dysfunction of thoracic region: Secondary | ICD-10-CM | POA: Diagnosis not present

## 2019-04-17 DIAGNOSIS — M62830 Muscle spasm of back: Secondary | ICD-10-CM | POA: Diagnosis not present

## 2019-04-17 DIAGNOSIS — M9901 Segmental and somatic dysfunction of cervical region: Secondary | ICD-10-CM | POA: Diagnosis not present

## 2019-04-23 DIAGNOSIS — M542 Cervicalgia: Secondary | ICD-10-CM | POA: Diagnosis not present

## 2019-04-23 DIAGNOSIS — M62830 Muscle spasm of back: Secondary | ICD-10-CM | POA: Diagnosis not present

## 2019-04-23 DIAGNOSIS — M9902 Segmental and somatic dysfunction of thoracic region: Secondary | ICD-10-CM | POA: Diagnosis not present

## 2019-04-23 DIAGNOSIS — M9901 Segmental and somatic dysfunction of cervical region: Secondary | ICD-10-CM | POA: Diagnosis not present
